# Patient Record
Sex: Female | Born: 1955 | Race: Black or African American | Hispanic: No | Marital: Single | Smoking: Never smoker
Health system: Southern US, Community
[De-identification: ages and names within clinical notes are randomized; demographics above are authoritative.]

## PROBLEM LIST (undated history)

## (undated) DIAGNOSIS — L309 Dermatitis, unspecified: Secondary | ICD-10-CM

## (undated) DIAGNOSIS — R011 Cardiac murmur, unspecified: Secondary | ICD-10-CM

## (undated) DIAGNOSIS — Z862 Personal history of diseases of the blood and blood-forming organs and certain disorders involving the immune mechanism: Secondary | ICD-10-CM

## (undated) DIAGNOSIS — Z8614 Personal history of Methicillin resistant Staphylococcus aureus infection: Secondary | ICD-10-CM

## (undated) DIAGNOSIS — Z8709 Personal history of other diseases of the respiratory system: Secondary | ICD-10-CM

## (undated) DIAGNOSIS — E559 Vitamin D deficiency, unspecified: Secondary | ICD-10-CM

## (undated) DIAGNOSIS — J309 Allergic rhinitis, unspecified: Secondary | ICD-10-CM

## (undated) DIAGNOSIS — Z872 Personal history of diseases of the skin and subcutaneous tissue: Secondary | ICD-10-CM

## (undated) DIAGNOSIS — N6019 Diffuse cystic mastopathy of unspecified breast: Secondary | ICD-10-CM

## (undated) DIAGNOSIS — H6690 Otitis media, unspecified, unspecified ear: Secondary | ICD-10-CM

## (undated) DIAGNOSIS — J029 Acute pharyngitis, unspecified: Secondary | ICD-10-CM

## (undated) HISTORY — DX: Acute pharyngitis, unspecified: J02.9

## (undated) HISTORY — DX: Dermatitis, unspecified: L30.9

## (undated) HISTORY — DX: Personal history of Methicillin resistant Staphylococcus aureus infection: Z86.14

## (undated) HISTORY — DX: Personal history of other diseases of the respiratory system: Z87.09

## (undated) HISTORY — PX: TUBAL LIGATION: SHX77

## (undated) HISTORY — DX: Vitamin D deficiency, unspecified: E55.9

## (undated) HISTORY — DX: Personal history of diseases of the blood and blood-forming organs and certain disorders involving the immune mechanism: Z86.2

## (undated) HISTORY — DX: Personal history of diseases of the skin and subcutaneous tissue: Z87.2

## (undated) HISTORY — DX: Otitis media, unspecified, unspecified ear: H66.90

## (undated) HISTORY — DX: Cardiac murmur, unspecified: R01.1

## (undated) HISTORY — DX: Allergic rhinitis, unspecified: J30.9

## (undated) HISTORY — DX: Diffuse cystic mastopathy of unspecified breast: N60.19

---

## 1997-08-09 ENCOUNTER — Ambulatory Visit (HOSPITAL_COMMUNITY): Admission: RE | Admit: 1997-08-09 | Discharge: 1997-08-09 | Payer: Self-pay | Admitting: Internal Medicine

## 1998-08-13 ENCOUNTER — Ambulatory Visit (HOSPITAL_COMMUNITY): Admission: RE | Admit: 1998-08-13 | Discharge: 1998-08-13 | Payer: Self-pay | Admitting: Internal Medicine

## 1998-08-13 ENCOUNTER — Encounter: Payer: Self-pay | Admitting: Internal Medicine

## 1999-03-06 ENCOUNTER — Encounter: Payer: Self-pay | Admitting: Emergency Medicine

## 1999-03-06 ENCOUNTER — Encounter: Admission: RE | Admit: 1999-03-06 | Discharge: 1999-03-06 | Payer: Self-pay | Admitting: Emergency Medicine

## 1999-06-22 ENCOUNTER — Other Ambulatory Visit: Admission: RE | Admit: 1999-06-22 | Discharge: 1999-06-22 | Payer: Self-pay | Admitting: Gynecology

## 1999-08-17 ENCOUNTER — Ambulatory Visit (HOSPITAL_COMMUNITY): Admission: RE | Admit: 1999-08-17 | Discharge: 1999-08-17 | Payer: Self-pay | Admitting: Internal Medicine

## 1999-08-17 ENCOUNTER — Encounter: Payer: Self-pay | Admitting: Internal Medicine

## 2000-08-02 ENCOUNTER — Other Ambulatory Visit: Admission: RE | Admit: 2000-08-02 | Discharge: 2000-08-02 | Payer: Self-pay | Admitting: Gynecology

## 2000-08-30 ENCOUNTER — Encounter: Payer: Self-pay | Admitting: Internal Medicine

## 2000-08-30 ENCOUNTER — Ambulatory Visit (HOSPITAL_COMMUNITY): Admission: RE | Admit: 2000-08-30 | Discharge: 2000-08-30 | Payer: Self-pay | Admitting: Internal Medicine

## 2000-11-30 ENCOUNTER — Other Ambulatory Visit: Admission: RE | Admit: 2000-11-30 | Discharge: 2000-11-30 | Payer: Self-pay | Admitting: Gynecology

## 2001-11-23 ENCOUNTER — Other Ambulatory Visit: Admission: RE | Admit: 2001-11-23 | Discharge: 2001-11-23 | Payer: Self-pay | Admitting: Gynecology

## 2001-11-28 ENCOUNTER — Encounter: Payer: Self-pay | Admitting: Internal Medicine

## 2001-11-28 ENCOUNTER — Ambulatory Visit (HOSPITAL_COMMUNITY): Admission: RE | Admit: 2001-11-28 | Discharge: 2001-11-28 | Payer: Self-pay | Admitting: Internal Medicine

## 2002-12-04 ENCOUNTER — Encounter: Payer: Self-pay | Admitting: Gynecology

## 2002-12-04 ENCOUNTER — Ambulatory Visit (HOSPITAL_COMMUNITY): Admission: RE | Admit: 2002-12-04 | Discharge: 2002-12-04 | Payer: Self-pay | Admitting: Gynecology

## 2002-12-11 ENCOUNTER — Encounter: Payer: Self-pay | Admitting: Gynecology

## 2002-12-11 ENCOUNTER — Encounter: Admission: RE | Admit: 2002-12-11 | Discharge: 2002-12-11 | Payer: Self-pay | Admitting: Gynecology

## 2003-01-07 ENCOUNTER — Other Ambulatory Visit: Admission: RE | Admit: 2003-01-07 | Discharge: 2003-01-07 | Payer: Self-pay | Admitting: Gynecology

## 2003-04-20 DIAGNOSIS — R011 Cardiac murmur, unspecified: Secondary | ICD-10-CM

## 2003-04-20 HISTORY — DX: Cardiac murmur, unspecified: R01.1

## 2003-05-23 ENCOUNTER — Encounter: Admission: RE | Admit: 2003-05-23 | Discharge: 2003-05-23 | Payer: Self-pay | Admitting: Internal Medicine

## 2003-12-20 ENCOUNTER — Encounter: Admission: RE | Admit: 2003-12-20 | Discharge: 2003-12-20 | Payer: Self-pay | Admitting: Gynecology

## 2004-01-08 ENCOUNTER — Other Ambulatory Visit: Admission: RE | Admit: 2004-01-08 | Discharge: 2004-01-08 | Payer: Self-pay | Admitting: Gynecology

## 2004-01-22 ENCOUNTER — Encounter (INDEPENDENT_AMBULATORY_CARE_PROVIDER_SITE_OTHER): Payer: Self-pay | Admitting: Cardiovascular Disease

## 2004-01-22 ENCOUNTER — Ambulatory Visit (HOSPITAL_COMMUNITY): Admission: RE | Admit: 2004-01-22 | Discharge: 2004-01-22 | Payer: Self-pay | Admitting: Internal Medicine

## 2004-01-30 ENCOUNTER — Encounter: Admission: RE | Admit: 2004-01-30 | Discharge: 2004-01-30 | Payer: Self-pay | Admitting: Gynecology

## 2004-07-24 ENCOUNTER — Encounter: Admission: RE | Admit: 2004-07-24 | Discharge: 2004-07-24 | Payer: Self-pay | Admitting: Gynecology

## 2004-12-01 ENCOUNTER — Encounter: Admission: RE | Admit: 2004-12-01 | Discharge: 2004-12-01 | Payer: Self-pay | Admitting: Internal Medicine

## 2005-02-11 ENCOUNTER — Other Ambulatory Visit: Admission: RE | Admit: 2005-02-11 | Discharge: 2005-02-11 | Payer: Self-pay | Admitting: Gynecology

## 2005-02-19 ENCOUNTER — Encounter: Admission: RE | Admit: 2005-02-19 | Discharge: 2005-02-19 | Payer: Self-pay | Admitting: Gynecology

## 2005-10-07 ENCOUNTER — Encounter: Admission: RE | Admit: 2005-10-07 | Discharge: 2005-10-07 | Payer: Self-pay | Admitting: Internal Medicine

## 2006-01-27 ENCOUNTER — Encounter: Admission: RE | Admit: 2006-01-27 | Discharge: 2006-01-27 | Payer: Self-pay | Admitting: Occupational Medicine

## 2006-08-23 ENCOUNTER — Other Ambulatory Visit: Admission: RE | Admit: 2006-08-23 | Discharge: 2006-08-23 | Payer: Self-pay | Admitting: Gynecology

## 2007-06-16 IMAGING — CR DG WRIST 2V*R*
1 series · 1 of 1 positions shown · non-contrast
Comparison: none

CLINICAL DATA: Radial right wrist pain for the past 2 to 3 months. No known
injury.

RIGHT WRIST - 2 VIEW

[view not recorded]
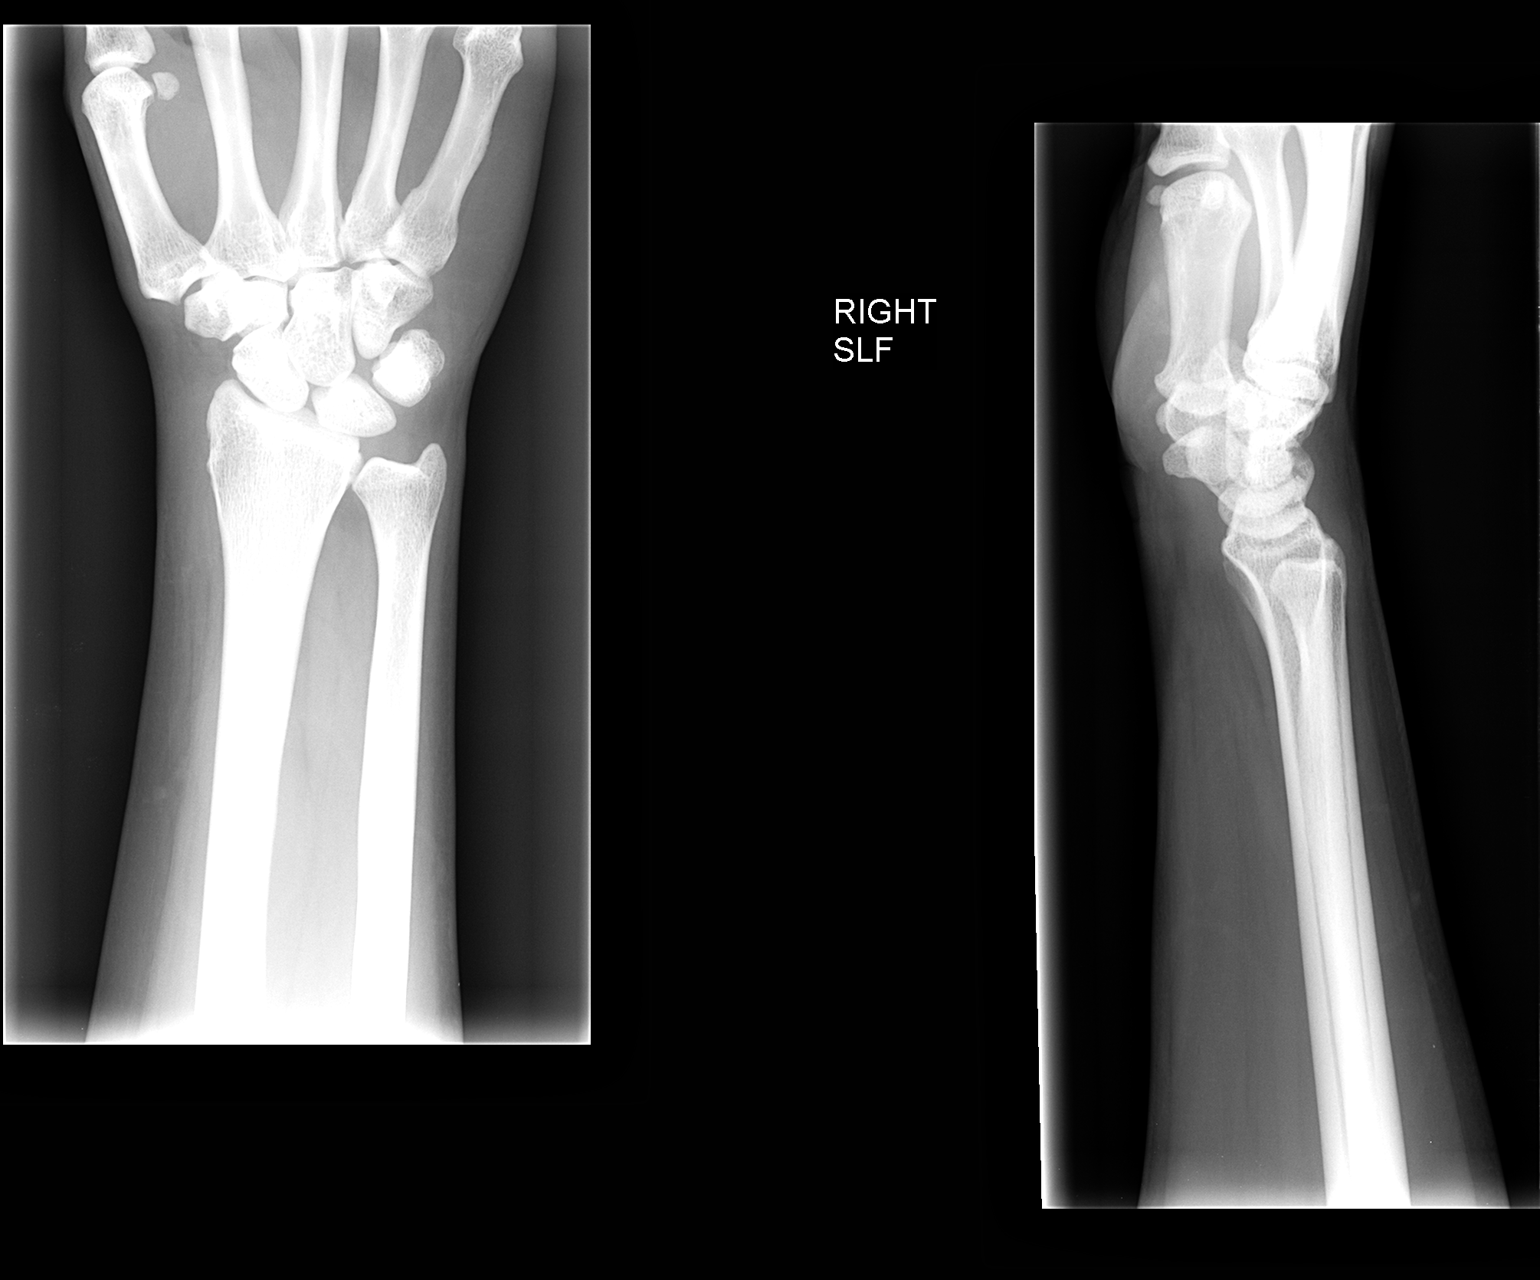

[1 of 1 positions shown; findings below may reference images not displayed]

FINDINGS: Normal appearing bones and soft tissues.

IMPRESSION

Normal examination.

## 2007-09-08 ENCOUNTER — Other Ambulatory Visit: Admission: RE | Admit: 2007-09-08 | Discharge: 2007-09-08 | Payer: Self-pay | Admitting: Gynecology

## 2008-04-29 ENCOUNTER — Encounter: Admission: RE | Admit: 2008-04-29 | Discharge: 2008-04-29 | Payer: Self-pay | Admitting: Internal Medicine

## 2011-05-10 ENCOUNTER — Other Ambulatory Visit: Payer: Self-pay | Admitting: Internal Medicine

## 2011-05-10 ENCOUNTER — Ambulatory Visit
Admission: RE | Admit: 2011-05-10 | Discharge: 2011-05-10 | Disposition: A | Discharge status: Home / Self Care | Payer: BC Managed Care – PPO | Source: Ambulatory Visit | Attending: Internal Medicine | Admitting: Internal Medicine

## 2011-05-10 DIAGNOSIS — R05 Cough: Secondary | ICD-10-CM

## 2011-09-03 ENCOUNTER — Emergency Department (HOSPITAL_COMMUNITY)
Admission: EM | Admit: 2011-09-03 | Discharge: 2011-09-03 | Disposition: A | Discharge status: Home / Self Care | Payer: No Typology Code available for payment source | Attending: Emergency Medicine | Admitting: Emergency Medicine

## 2011-09-03 ENCOUNTER — Encounter (HOSPITAL_COMMUNITY): Payer: Self-pay | Admitting: *Deleted

## 2011-09-03 DIAGNOSIS — Y92410 Unspecified street and highway as the place of occurrence of the external cause: Secondary | ICD-10-CM | POA: Insufficient documentation

## 2011-09-03 DIAGNOSIS — S335XXA Sprain of ligaments of lumbar spine, initial encounter: Secondary | ICD-10-CM | POA: Insufficient documentation

## 2011-09-03 DIAGNOSIS — S39012A Strain of muscle, fascia and tendon of lower back, initial encounter: Secondary | ICD-10-CM

## 2011-09-03 DIAGNOSIS — Y998 Other external cause status: Secondary | ICD-10-CM | POA: Insufficient documentation

## 2011-09-03 MED ORDER — PREDNISONE 20 MG PO TABS
60.0000 mg | ORAL_TABLET | Freq: Once | ORAL | Status: AC
  Administered 2011-09-03: 60 mg via ORAL
  Filled 2011-09-03: qty 3

## 2011-09-03 MED ORDER — CYCLOBENZAPRINE HCL 5 MG PO TABS: 5.0000 mg | ORAL_TABLET | Freq: Two times a day (BID) | ORAL | Status: AC | PRN

## 2011-09-03 MED ORDER — PREDNISONE 20 MG PO TABS: 40.0000 mg | ORAL_TABLET | Freq: Every day | ORAL | Status: AC

## 2011-09-03 MED ORDER — HYDROCODONE-ACETAMINOPHEN 5-325 MG PO TABS: 2.0000 | ORAL_TABLET | ORAL | Status: AC | PRN

## 2011-09-03 MED ORDER — CYCLOBENZAPRINE HCL 10 MG PO TABS
5.0000 mg | ORAL_TABLET | Freq: Once | ORAL | Status: AC
  Administered 2011-09-03: 5 mg via ORAL
  Filled 2011-09-03: qty 2

## 2011-09-03 NOTE — ED Notes (Signed)
NP at bedside.
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
## 2011-09-03 NOTE — Discharge Instructions (Signed)

## 2011-09-04 NOTE — ED Provider Notes (Signed)
Medical screening examination/treatment/procedure(s) were performed by non-physician practitioner and as supervising physician I was immediately available for consultation/collaboration.   Loren Racer, MD 09/04/11 [phone]

## 2011-10-29 ENCOUNTER — Ambulatory Visit (HOSPITAL_COMMUNITY)
Admission: RE | Admit: 2011-10-29 | Discharge: 2011-10-29 | Disposition: A | Discharge status: Home / Self Care | Payer: BC Managed Care – PPO | Source: Ambulatory Visit | Attending: Internal Medicine | Admitting: Internal Medicine

## 2011-10-29 ENCOUNTER — Other Ambulatory Visit: Payer: Self-pay | Admitting: Internal Medicine

## 2011-10-29 DIAGNOSIS — E039 Hypothyroidism, unspecified: Secondary | ICD-10-CM

## 2011-10-29 DIAGNOSIS — E049 Nontoxic goiter, unspecified: Secondary | ICD-10-CM | POA: Insufficient documentation

## 2012-05-04 ENCOUNTER — Encounter: Payer: Self-pay | Admitting: *Deleted

## 2012-05-04 DIAGNOSIS — E559 Vitamin D deficiency, unspecified: Secondary | ICD-10-CM | POA: Insufficient documentation

## 2012-05-04 DIAGNOSIS — H6690 Otitis media, unspecified, unspecified ear: Secondary | ICD-10-CM | POA: Insufficient documentation

## 2019-11-20 ENCOUNTER — Ambulatory Visit: Payer: Self-pay | Attending: Internal Medicine

## 2019-11-20 DIAGNOSIS — Z23 Encounter for immunization: Secondary | ICD-10-CM

## 2019-11-20 NOTE — Progress Notes (Signed)
   Covid-19 Vaccination Clinic  Name:  Shawna Stephens    MRN: [id number] DOB: 1956/01/05  11/20/2019  [name] was observed post Covid-19 immunization for 15 minutes without incident. She was provided with Vaccine Information Sheet and instruction to access the V-Safe system.   [name] was instructed to call 911 with any severe reactions post vaccine: Marland Kitchen Difficulty breathing  . Swelling of face and throat  . A fast heartbeat  . A bad rash all over body  . Dizziness and weakness   Immunizations Administered    Name Date Dose VIS Date Route   Moderna COVID-19 Vaccine 11/20/2019  3:18 PM 0.5 mL 03/2019 Intramuscular   Manufacturer: Moderna   Lot: [id number]   NDC: 15830-940-76

## 2019-12-17 ENCOUNTER — Other Ambulatory Visit: Payer: Self-pay | Admitting: Occupational Medicine

## 2019-12-17 ENCOUNTER — Ambulatory Visit: Payer: Self-pay

## 2019-12-17 ENCOUNTER — Other Ambulatory Visit: Payer: Self-pay

## 2019-12-17 DIAGNOSIS — M79671 Pain in right foot: Secondary | ICD-10-CM

## 2019-12-18 ENCOUNTER — Ambulatory Visit: Payer: Self-pay | Attending: Internal Medicine

## 2019-12-18 DIAGNOSIS — Z23 Encounter for immunization: Secondary | ICD-10-CM

## 2019-12-18 NOTE — Progress Notes (Signed)
   Covid-19 Vaccination Clinic  Name:  Shawna Stephens    MRN: [id number] DOB: [birth year]  12/18/2019  Ms. Shawna Stephens was observed post Covid-19 immunization for 15 minutes without incident. She was provided with Vaccine Information Sheet and instruction to access the V-Safe system.   [name] was instructed to call 911 with any severe reactions post vaccine: Marland Kitchen Difficulty breathing  . Swelling of face and throat  . A fast heartbeat  . A bad rash all over body  . Dizziness and weakness   Immunizations Administered    Name Date Dose VIS Date Route   Moderna COVID-19 Vaccine 12/18/2019  3:17 PM 0.5 mL 03/2019 Intramuscular   Manufacturer: Moderna   Lot: [id number]   NDC: 67341-937-90

## 2021-03-17 ENCOUNTER — Other Ambulatory Visit: Payer: Self-pay | Admitting: Internal Medicine

## 2021-03-17 ENCOUNTER — Ambulatory Visit
Admission: RE | Admit: 2021-03-17 | Discharge: 2021-03-17 | Disposition: A | Discharge status: Home / Self Care | Payer: BC Managed Care – PPO | Source: Ambulatory Visit | Attending: Internal Medicine | Admitting: Internal Medicine

## 2021-03-17 DIAGNOSIS — R053 Chronic cough: Secondary | ICD-10-CM

## 2021-04-07 ENCOUNTER — Other Ambulatory Visit (HOSPITAL_COMMUNITY): Payer: Self-pay

## 2021-04-07 DIAGNOSIS — R1310 Dysphagia, unspecified: Secondary | ICD-10-CM

## 2021-05-04 ENCOUNTER — Ambulatory Visit (HOSPITAL_COMMUNITY): Payer: BC Managed Care – PPO

## 2021-05-04 ENCOUNTER — Other Ambulatory Visit: Payer: Self-pay

## 2021-05-04 ENCOUNTER — Encounter (HOSPITAL_COMMUNITY): Payer: Self-pay

## 2021-05-04 ENCOUNTER — Ambulatory Visit (HOSPITAL_COMMUNITY): Admission: RE | Admit: 2021-05-04 | Payer: BC Managed Care – PPO | Source: Ambulatory Visit

## 2021-05-12 ENCOUNTER — Other Ambulatory Visit: Payer: Self-pay

## 2021-05-12 ENCOUNTER — Ambulatory Visit (HOSPITAL_COMMUNITY)
Admission: RE | Admit: 2021-05-12 | Discharge: 2021-05-12 | Disposition: A | Discharge status: Home / Self Care | Payer: BC Managed Care – PPO | Source: Ambulatory Visit | Attending: Internal Medicine | Admitting: Internal Medicine

## 2021-05-12 DIAGNOSIS — R1310 Dysphagia, unspecified: Secondary | ICD-10-CM | POA: Insufficient documentation

## 2022-08-04 IMAGING — CR DG CHEST 2V
2 series · 2 of 2 positions shown · non-contrast
Comparison: May 10, 2011.

CLINICAL DATA: Productive cough.

EXAM:
CHEST - 2 VIEW

[w chest pa]
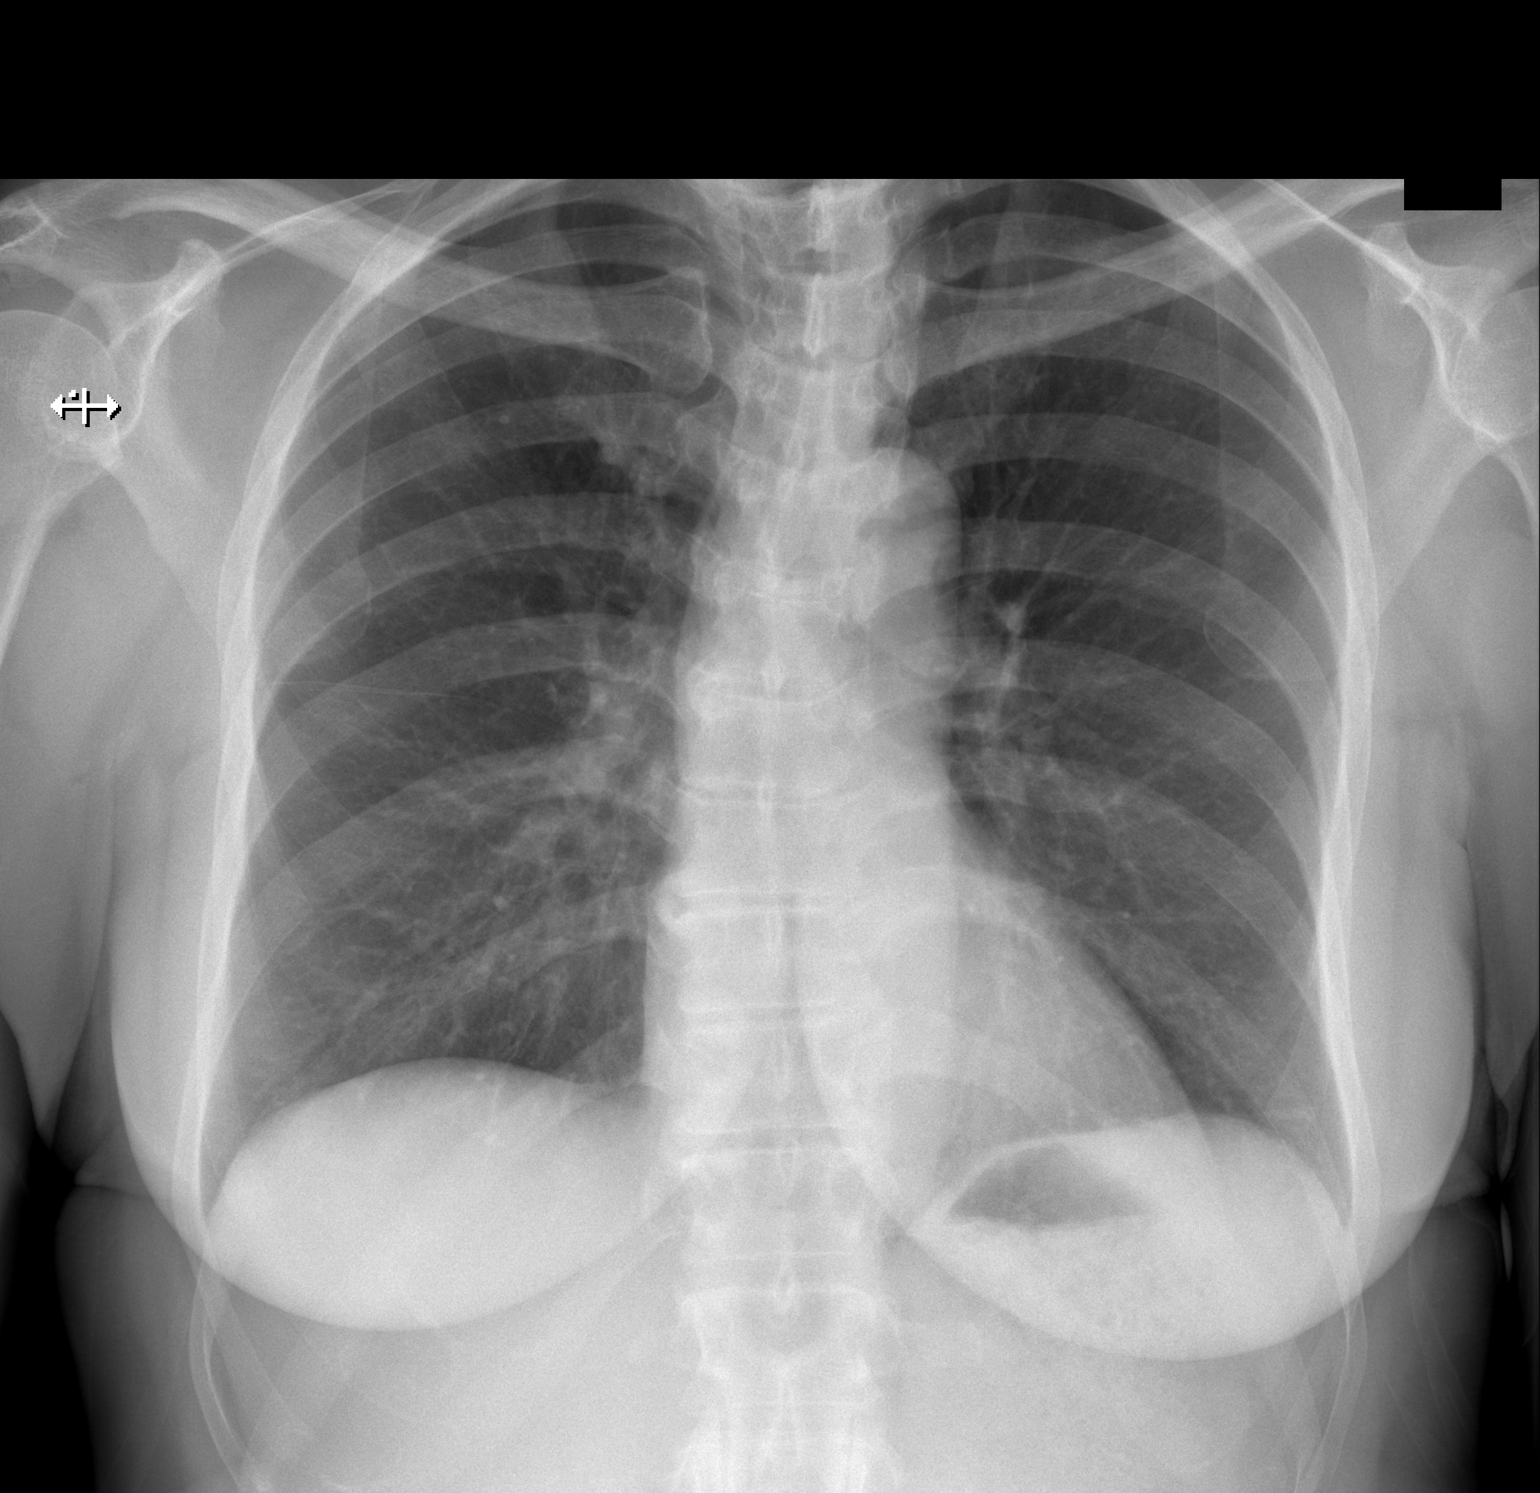

[w chest lat]
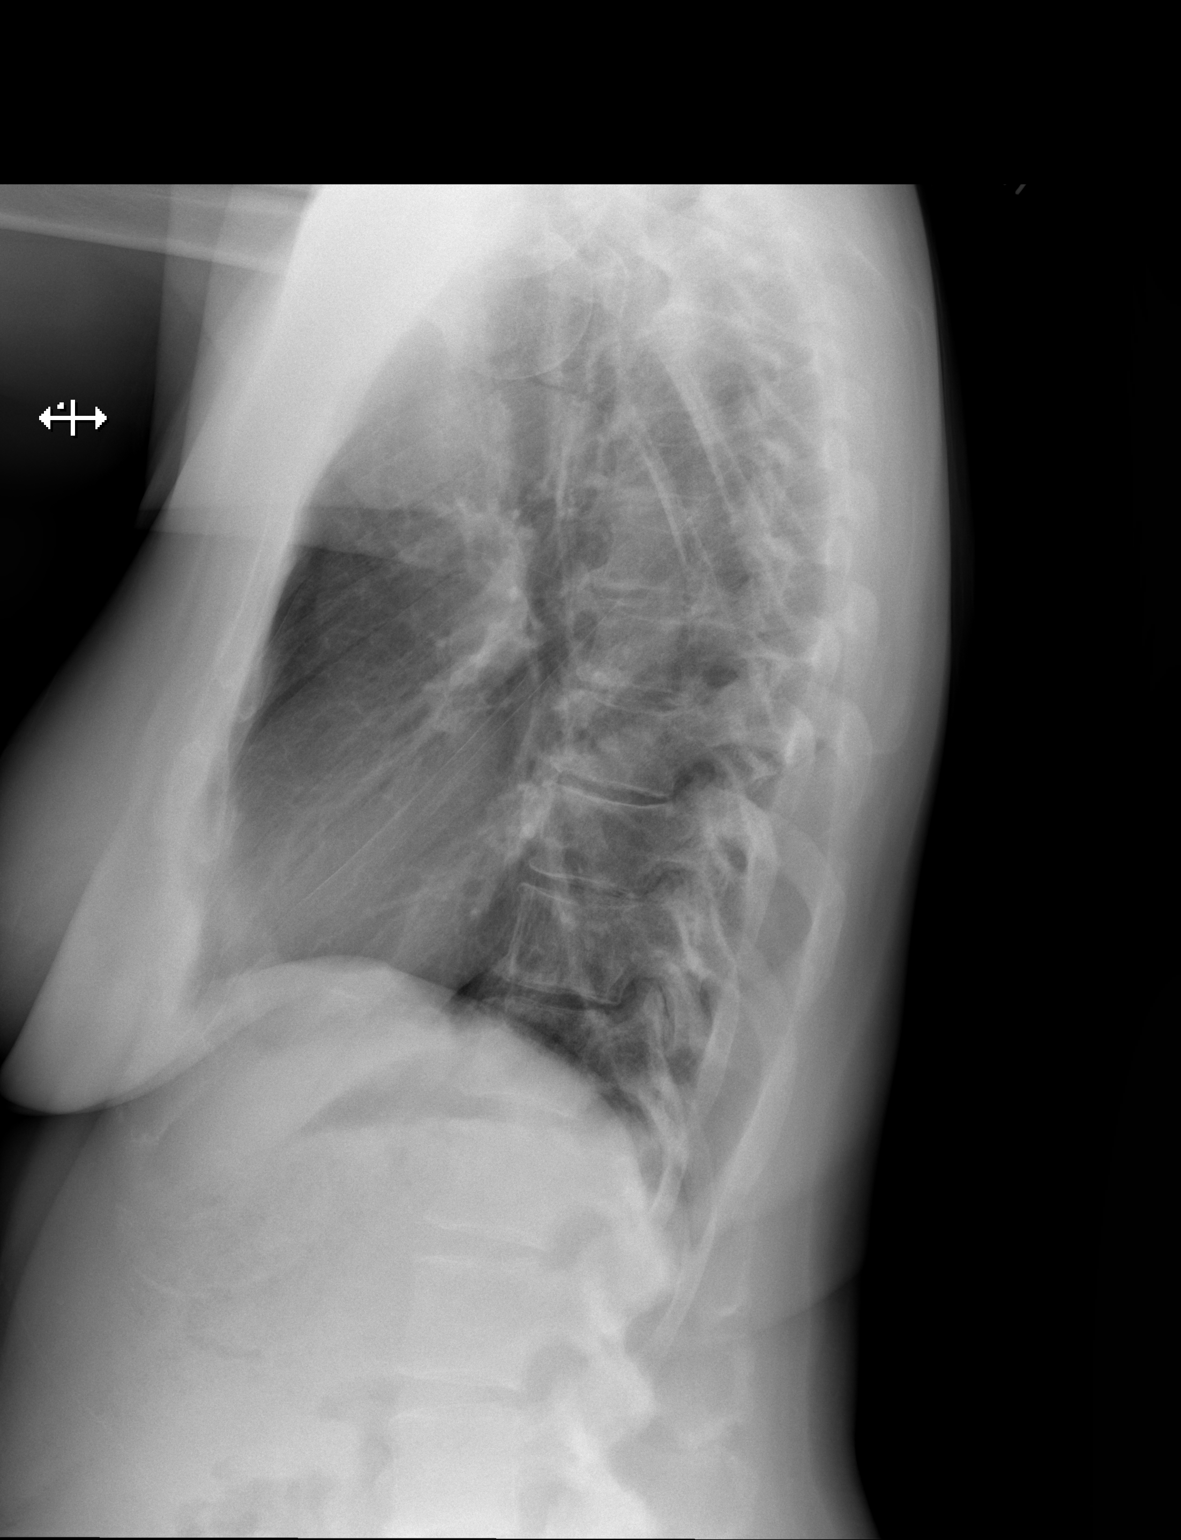

[2 of 2 positions shown; findings below may reference images not displayed]

FINDINGS: The heart size and mediastinal contours are within normal limits.
Both lungs are clear. The visualized skeletal structures are
unremarkable.
IMPRESSION: No active cardiopulmonary disease.

## 2023-03-08 ENCOUNTER — Other Ambulatory Visit: Payer: Self-pay | Admitting: Internal Medicine

## 2023-03-08 ENCOUNTER — Ambulatory Visit
Admission: RE | Admit: 2023-03-08 | Discharge: 2023-03-08 | Disposition: A | Discharge status: Home / Self Care | Payer: BC Managed Care – PPO | Source: Ambulatory Visit | Attending: Internal Medicine | Admitting: Internal Medicine

## 2023-03-08 DIAGNOSIS — M25551 Pain in right hip: Secondary | ICD-10-CM

## 2023-08-26 ENCOUNTER — Ambulatory Visit: Admitting: Podiatry

## 2023-09-02 ENCOUNTER — Ambulatory Visit (INDEPENDENT_AMBULATORY_CARE_PROVIDER_SITE_OTHER): Admitting: Podiatry

## 2023-09-02 ENCOUNTER — Encounter: Payer: Self-pay | Admitting: Podiatry

## 2023-09-02 VITALS — Ht 68.0 in | Wt 168.0 lb

## 2023-09-02 DIAGNOSIS — M722 Plantar fascial fibromatosis: Secondary | ICD-10-CM | POA: Diagnosis not present

## 2023-09-02 DIAGNOSIS — Q666 Other congenital valgus deformities of feet: Secondary | ICD-10-CM

## 2023-09-02 NOTE — Progress Notes (Signed)
  Subjective:  Patient ID: Shawna Stephens, female    DOB: 03-20-1956,  MRN: [id number]  Chief Complaint  Patient presents with   Foot Pain    Patient is here for left heel pain that started 2 months ago.    68 y.o. female presents with the above complaint.  Patient presents with left heel pain that started about 2 months ago has progressed gotten worse worse with ambulation is with pressure has not seen MRIs prior to seeing me denies any other acute complaints would like to discuss treatment option she does not want steroid injection as she has a high risk of developing keloid infection.  Pain scale 7 out of 10 dull aching nature   Review of Systems: Negative except as noted in the HPI. Denies N/V/F/Ch.  Past Medical History:  Diagnosis Date   Allergic rhinitis    Dermatitis    nonspecific   Fibrocystic breast disease    H/O keloid of skin    Heart murmur 2005   benign   History of anemia    mild   Hx MRSA infection    Hx of restrictive lung disease    mild   Otitis media    asymptomatic left   Pharyngitis    nonspecific   Vitamin D deficiency    No current outpatient medications on file.  Social History   Tobacco Use  Smoking Status Never  Smokeless Tobacco Not on file    Allergies  Allergen Reactions   Diclofenac Itching   Objective:  There were no vitals filed for this visit. Body mass index is 25.54 kg/m. Constitutional Well developed. Well nourished.  Vascular Dorsalis pedis pulses palpable bilaterally. Posterior tibial pulses palpable bilaterally. Capillary refill normal to all digits.  No cyanosis or clubbing noted. Pedal hair growth normal.  Neurologic Normal speech. Oriented to person, place, and time. Epicritic sensation to light touch grossly present bilaterally.  Dermatologic Nails well groomed and normal in appearance. No open wounds. No skin lesions.  Orthopedic: Normal joint ROM without pain or crepitus bilaterally. No visible  deformities. Tender to palpation at the calcaneal tuber left. No pain with calcaneal squeeze left. Ankle ROM diminished range of motion left. Silfverskiold Test: negative bilaterally.   Radiographs: None  Assessment:  No diagnosis found. Plan:  Patient was evaluated and treated and all questions answered.  Plantar Fasciitis, left - XR reviewed as above.  - Educated on icing and stretching. Instructions given.  - Injection delivered to the plantar fascia as below. - DME: Plantar fascial brace dispensed to support the medial longitudinal arch of the foot and offload pressure from the heel and prevent arch collapse during weightbearing - Pharmacologic management: None  Pes planovalgus -I explained to patient the etiology of pes planovalgus and relationship with Planter fasciitis and various treatment options were discussed.  Given patient foot structure in the setting of Planter fasciitis I believe patient will benefit from custom-made orthotics to help control the hindfoot motion support the arch of the foot and take the stress away from plantar fascial.  Patient agrees with the plan like to proceed with orthotics -Patient was casted for orthotics   Procedure: Injection Tendon/Ligament Location: Left plantar fascia at the glabrous junction; medial approach. Skin Prep: alcohol Injectate: 0.5 cc 0.5% marcaine plain, 0.5 cc of 1% Lidocaine, 0.5 cc kenalog 10. Disposition: Patient tolerated procedure well. Injection site dressed with a band-aid.  No follow-ups on file.

## 2023-10-05 ENCOUNTER — Telehealth: Payer: Self-pay | Admitting: Podiatry

## 2023-10-05 ENCOUNTER — Ambulatory Visit (INDEPENDENT_AMBULATORY_CARE_PROVIDER_SITE_OTHER): Admitting: Podiatry

## 2023-10-05 DIAGNOSIS — M722 Plantar fascial fibromatosis: Secondary | ICD-10-CM | POA: Diagnosis not present

## 2023-10-05 DIAGNOSIS — Q666 Other congenital valgus deformities of feet: Secondary | ICD-10-CM

## 2023-10-05 MED ORDER — MELOXICAM 15 MG PO TABS: 15.0000 mg | ORAL_TABLET | Freq: Every day | ORAL | 0 refills | Status: AC

## 2023-10-05 MED ORDER — METHYLPREDNISOLONE 4 MG PO TBPK: ORAL_TABLET | ORAL | 0 refills | Status: AC

## 2023-10-05 NOTE — Progress Notes (Signed)
  Subjective:  Patient ID: Shawna Stephens, female    DOB: 04/15/1956,  MRN: [id number]  Chief Complaint  Patient presents with   Foot Pain    Pt stated that the pain is about the same     68 y.o. female presents with the above complaint.  Patient presents with follow-up of left heel pain.  She states that the pain is doing a little bit better the bracing helps.  She is also here to pick up her orthotics denies any other acute complaints   Review of Systems: Negative except as noted in the HPI. Denies N/V/F/Ch.  Past Medical History:  Diagnosis Date   Allergic rhinitis    Dermatitis    nonspecific   Fibrocystic breast disease    H/O keloid of skin    Heart murmur 2005   benign   History of anemia    mild   Hx MRSA infection    Hx of restrictive lung disease    mild   Otitis media    asymptomatic left   Pharyngitis    nonspecific   Vitamin D deficiency     Current Outpatient Medications:    meloxicam (MOBIC) 15 MG tablet, Take 1 tablet (15 mg total) by mouth daily., Disp: 30 tablet, Rfl: 0   methylPREDNISolone (MEDROL DOSEPAK) 4 MG TBPK tablet, Take as directed, Disp: 21 each, Rfl: 0  Social History   Tobacco Use  Smoking Status Never  Smokeless Tobacco Not on file    Allergies  Allergen Reactions   Diclofenac Itching   Objective:  There were no vitals filed for this visit. There is no height or weight on file to calculate BMI. Constitutional Well developed. Well nourished.  Vascular Dorsalis pedis pulses palpable bilaterally. Posterior tibial pulses palpable bilaterally. Capillary refill normal to all digits.  No cyanosis or clubbing noted. Pedal hair growth normal.  Neurologic Normal speech. Oriented to person, place, and time. Epicritic sensation to light touch grossly present bilaterally.  Dermatologic Nails well groomed and normal in appearance. No open wounds. No skin lesions.  Orthopedic: Normal joint ROM without pain or crepitus  bilaterally. No visible deformities. Tender to palpation at the calcaneal tuber left. No pain with calcaneal squeeze left. Ankle ROM diminished range of motion left. Silfverskiold Test: negative bilaterally.   Radiographs: None  Assessment:   1. Pes planovalgus [Q66.6]   2. Plantar fasciitis of left foot [M72.2]    Plan:  Patient was evaluated and treated and all questions answered.  Plantar Fasciitis, left - XR reviewed as above.  - Educated on icing and stretching. Instructions given.  - Patient has what injection would like to focus on oral medication and bracing - DME: Plantar fascial brace dispensed to support the medial longitudinal arch of the foot and offload pressure from the heel and prevent arch collapse during weightbearing - Pharmacologic management: Medrol Dosepak and meloxicam  Pes planovalgus -I explained to patient the etiology of pes planovalgus and relationship with Planter fasciitis and various treatment options were discussed.  Given patient foot structure in the setting of Planter fasciitis I believe patient will benefit from custom-made orthotics to help control the hindfoot motion support the arch of the foot and take the stress away from plantar fascial.  Patient agrees with the plan like to proceed with orthotics - Orthotics were dispensed they are functioning well   No follow-ups on file.

## 2023-10-05 NOTE — Telephone Encounter (Signed)
 lft mess on vmail for pt to call me back. She advised front desk rep she had question about FMLA

## 2023-10-10 ENCOUNTER — Telehealth: Payer: Self-pay | Admitting: Podiatry

## 2023-10-10 NOTE — Telephone Encounter (Signed)
 cld pt back-had lft mess 10/05/23. She is going to have her job send me FMLA-intermittent forms for her and will need accommodations no more than 10 hours and 2-3 per week(10 hrs) from 10/05/23-04/18/24. She said will not need all the time,  I gave the pt my fax#

## 2023-10-12 ENCOUNTER — Telehealth: Payer: Self-pay | Admitting: Podiatry

## 2023-10-12 DIAGNOSIS — Z0271 Encounter for disability determination: Secondary | ICD-10-CM

## 2023-10-12 NOTE — Telephone Encounter (Signed)
 Recd [employer] form via fax. Faxed form/notes to [phone] and mailed a copy to the patient at the address on file.

## 2023-11-01 ENCOUNTER — Other Ambulatory Visit: Payer: Self-pay | Admitting: Podiatry

## 2023-11-01 ENCOUNTER — Encounter: Payer: Self-pay | Admitting: Dermatology

## 2023-11-01 ENCOUNTER — Ambulatory Visit (INDEPENDENT_AMBULATORY_CARE_PROVIDER_SITE_OTHER): Payer: BC Managed Care – PPO | Admitting: Dermatology

## 2023-11-01 VITALS — BP 117/81 | HR 90

## 2023-11-01 DIAGNOSIS — L910 Hypertrophic scar: Secondary | ICD-10-CM

## 2023-11-01 DIAGNOSIS — L300 Nummular dermatitis: Secondary | ICD-10-CM

## 2023-11-01 NOTE — Progress Notes (Signed)
   New Patient Visit   Subjective  Shawna Stephens is a 68 y.o. female who presents for the following: Keloid  Patient states she has keloids located at the scattered that she would like to have examined. Patient reports the areas have been there for several years. She reports the areas are bothersome. She reports the area can be very itchy. Patient rates irritation 5 out of 10. Patient reports she has previously been treated for these areas (Dr. Debarah). She has had Keloids removed from her ears and she has had some injected with Kenalog.  The following portions of the chart were reviewed this encounter and updated as appropriate: medications, allergies, medical history  Review of Systems:  No other skin or systemic complaints except as noted in HPI or Assessment and Plan.  Objective  Well appearing patient in no apparent distress; mood and affect are within normal limits.  A full examination was performed including scalp, head, eyes, ears, nose, lips, neck, chest, axillae, abdomen, back, buttocks, bilateral upper extremities, bilateral lower extremities, hands, feet, fingers, toes, fingernails, and toenails. All findings within normal limits unless otherwise noted below.   Relevant exam findings are noted in the Assessment and Plan.         Assessment & Plan   1. Keloids - Assessment: Patient has a history of keloid formation, with multiple keloids present on various body parts including ears, chest, and arm. These keloids resulted from various injuries, including a vaccination, pimple, and surgery. Previous treatment by Dr. Milissa approximately 25 years ago included intralesional injections, which provided some flattening effect, particularly on the ears. The patient also underwent surgery for one of the keloids. The most recent keloid formed from a pimple. The keloids are described as varying in size and thickness, with some appearing partially treated (flat in the middle).  - Plan:     Recommend intralesional corticosteroid injections for flattening and reducing itching of keloids    Suggest use of silicone patches (Scar-Away) as a secondary treatment option:     - Apply patch daily, leaving on for 24 hours     - Remove patch at night, massage area for 10 minutes, then reapply new patch     - Continue treatment for approximately 6 months    Advise patient to apply patches to both new and old keloid areas for comparison    Include Scar-Away recommendation in after-visit summary  2. Nummular Eczema - Assessment: Patient reports itching on legs during winter months for the past couple of years, with visible discoloration. 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 The condition is distinct from childhood eczema and often begins with itching before visible skin changes occur.  - Plan:    Prescribe triamcinolone  0.1% topical cream:     - Apply twice daily for 2 weeks when flared     - If symptoms persist, advise 2 weeks on, 2 weeks off treatment cycle     - Discontinue use if symptoms resolve before 2 weeks    Educate patient on nummular eczema characteristics and management   No follow-ups on file.  I, Jetta Ager, am acting as Neurosurgeon for [employer], DO.  Documentation: I have reviewed the above documentation for accuracy and completeness, and I agree with the above.  Delon Lenis, DO

## 2023-11-01 NOTE — Patient Instructions (Addendum)
 Date: Tue Nov 01 2023  Hello Shawna Stephens,  Thank you for visiting today. Here is a summary of the key instructions:  - Scar-Away Silicone Patches:   - Apply patch at night before bed   - Leave on for 24 hours   - Remove patch the next night   - Massage area for 10 minutes   - Apply new patch   - Repeat daily    - Treatment Areas:   - Apply patches to one new keloid area   - Apply patches to one old keloid area to compare results   - Continue treatment for 6 months  - Triamcinolone  0.1% Cream:   - Apply twice daily for up to 2 weeks when flared   - Stop for 2 weeks after each 2-week treatment period   - Can stop earlier if symptoms improve  - Skin Care Instructions:   - Apply Scar-Away patches to clean, dry skin before using any lotions   - Massage keloid areas for 10 minutes when changing patches  - Follow-up: Return for appointment in 6 months  Please reach out if you have any questions or concerns.  Warm regards,  Dr. Delon Lenis Dermat[facility]      Important Information   Due to recent changes in healthcare laws, you may see results of your pathology and/or laboratory studies on MyChart before the doctors have had a chance to review them. We understand that in some cases there may be results that are confusing or concerning to you. Please understand that not all results are received at the same time and often the doctors may need to interpret multiple results in order to provide you with the best plan of care or course of treatment. Therefore, we ask that you please give us  2 business days to thoroughly review all your results before contacting the office for clarification. Should we see a critical lab result, you will be contacted sooner.     If You Need Anything After Your Visit   If you have any questions or concerns for your doctor, please call our main line at [phone]. If no one answers, please leave a voicemail as directed and we will return your  call as soon as possible. Messages left after 4 pm will be answered the following business day.    You may also send us  a message via MyChart. We typically respond to MyChart messages within 1-2 business days.  For prescription refills, please ask your pharmacy to contact our office. Our fax number is [phone].  If you have an urgent issue when the clinic is closed that cannot wait until the next business day, you can page your doctor at the number below.     Please note that while we do our best to be available for urgent issues outside of office hours, we are not available 24/7.    If you have an urgent issue and are unable to reach us , you may choose to seek medical care at your doctor's office, retail clinic, urgent care center, or emergency room.   If you have a medical emergency, please immediately call 911 or go to the emergency department. In the event of inclement weather, please call our main line at [phone] for an update on the status of any delays or closures.  Dermatology Medication Tips: Please keep the boxes that topical medications come in in order to help keep track of the instructions about where and how to use these. Pharmacies typically print  the medication instructions only on the boxes and not directly on the medication tubes.   If your medication is too expensive, please contact our office at [phone] or send us  a message through MyChart.    We are unable to tell what your co-pay for medications will be in advance as this is different depending on your insurance coverage. However, we may be able to find a substitute medication at lower cost or fill out paperwork to get insurance to cover a needed medication.    If a prior authorization is required to get your medication covered by your insurance company, please allow us  1-2 business days to complete this process.   Drug prices often vary depending on where the prescription is filled and some pharmacies may  offer cheaper prices.   The website www.goodrx.com contains coupons for medications through different pharmacies. The prices here do not account for what the cost may be with help from insurance (it may be cheaper with your insurance), but the website can give you the price if you did not use any insurance.  - You can print the associated coupon and take it with your prescription to the pharmacy.  - You may also stop by our office during regular business hours and pick up a GoodRx coupon card.  - If you need your prescription sent electronically to a different pharmacy, notify our office through [facility] or by phone at [phone]

## 2023-11-01 NOTE — Telephone Encounter (Signed)
 cld pt and she adv recd forms and thinks they need more info. I adv her to call them and she can inq what is needed and if more is needed from us  to let me know. she thought they let us  know if she is approved. I adv they contact her not us 

## 2023-11-02 ENCOUNTER — Ambulatory Visit: Admitting: Podiatry

## 2023-11-18 ENCOUNTER — Ambulatory Visit (INDEPENDENT_AMBULATORY_CARE_PROVIDER_SITE_OTHER): Admitting: Podiatry

## 2023-11-18 DIAGNOSIS — M722 Plantar fascial fibromatosis: Secondary | ICD-10-CM

## 2023-11-18 DIAGNOSIS — Q666 Other congenital valgus deformities of feet: Secondary | ICD-10-CM

## 2023-11-18 NOTE — Progress Notes (Signed)
  Subjective:  Patient ID: Shawna Stephens, female    DOB: 08/23/1955,  MRN: [id number]  Chief Complaint  Patient presents with   Plantar Fasciitis    Pt stated that she is doing much better     68 y.o. female presents with the above complaint.  Patient presents with bilateral plantar fasciitis follow-up.  She states she is doing better.  It is manageable she still has some residual pain denies any other acute complaints meloxicam  helped   Review of Systems: Negative except as noted in the HPI. Denies N/V/F/Ch.  Past Medical History:  Diagnosis Date   Allergic rhinitis    Dermatitis    nonspecific   Fibrocystic breast disease    H/O keloid of skin    Heart murmur 2005   benign   History of anemia    mild   Hx MRSA infection    Hx of restrictive lung disease    mild   Otitis media    asymptomatic left   Pharyngitis    nonspecific   Vitamin D deficiency     Current Outpatient Medications:    meloxicam  (MOBIC ) 15 MG tablet, Take 1 tablet (15 mg total) by mouth daily., Disp: 30 tablet, Rfl: 0   methylPREDNISolone  (MEDROL  DOSEPAK) 4 MG TBPK tablet, Take as directed (Patient not taking: Reported on 11/01/2023), Disp: 21 each, Rfl: 0  Social History   Tobacco Use  Smoking Status Never  Smokeless Tobacco Not on file    Allergies  Allergen Reactions   Diclofenac Itching   Objective:  There were no vitals filed for this visit. There is no height or weight on file to calculate BMI. Constitutional Well developed. Well nourished.  Vascular Dorsalis pedis pulses palpable bilaterally. Posterior tibial pulses palpable bilaterally. Capillary refill normal to all digits.  No cyanosis or clubbing noted. Pedal hair growth normal.  Neurologic Normal speech. Oriented to person, place, and time. Epicritic sensation to light touch grossly present bilaterally.  Dermatologic Nails well groomed and normal in appearance. No open wounds. No skin lesions.  Orthopedic: Normal joint  ROM without pain or crepitus bilaterally. No visible deformities. Tender to palpation at the calcaneal tuber left. No pain with calcaneal squeeze left. Ankle ROM diminished range of motion left. Silfverskiold Test: negative bilaterally.   Radiographs: None  Assessment:   1. Plantar fasciitis of left foot    Plan:  Patient was evaluated and treated and all questions answered.  Plantar Fasciitis, left - XR reviewed as above.  - Educated on icing and stretching. Instructions given.  - Patient has what injection would like to focus on oral medication and bracing - DME: Plantar fascial brace dispensed to support the medial longitudinal arch of the foot and offload pressure from the heel and prevent arch collapse during weightbearing - Pharmacologic management: Continue meloxicam  as needed - Physical therapy prescription was given for the patient she will start immediately  Pes planovalgus -I explained to patient the etiology of pes planovalgus and relationship with Planter fasciitis and various treatment options were discussed.  Given patient foot structure in the setting of Planter fasciitis I believe patient will benefit from custom-made orthotics to help control the hindfoot motion support the arch of the foot and take the stress away from plantar fascial.  Patient agrees with the plan like to proceed with orthotics - Orthotics were dispensed they are functioning well   No follow-ups on file.

## 2023-12-23 ENCOUNTER — Ambulatory Visit: Admitting: Podiatry

## 2023-12-23 DIAGNOSIS — M722 Plantar fascial fibromatosis: Secondary | ICD-10-CM

## 2023-12-23 NOTE — Progress Notes (Signed)
  Subjective:  Patient ID: Shawna Stephens, female    DOB: 10-12-1955,  MRN: [id number]  Chief Complaint  Patient presents with   Plantar Fasciitis    Pt stated that she is doing okay she is still having some discomfort     68 y.o. female presents with the above complaint.  Patient presents with bilateral plantar fasciitis follow-up.  She states she is doing better.  It is manageable she still has some residual pain denies any other acute complaints meloxicam  helped   Review of Systems: Negative except as noted in the HPI. Denies N/V/F/Ch.  Past Medical History:  Diagnosis Date   Allergic rhinitis    Dermatitis    nonspecific   Fibrocystic breast disease    H/O keloid of skin    Heart murmur 2005   benign   History of anemia    mild   Hx MRSA infection    Hx of restrictive lung disease    mild   Otitis media    asymptomatic left   Pharyngitis    nonspecific   Vitamin D deficiency     Current Outpatient Medications:    meloxicam  (MOBIC ) 15 MG tablet, Take 1 tablet (15 mg total) by mouth daily., Disp: 30 tablet, Rfl: 0   methylPREDNISolone  (MEDROL  DOSEPAK) 4 MG TBPK tablet, Take as directed (Patient not taking: Reported on 11/01/2023), Disp: 21 each, Rfl: 0  Social History   Tobacco Use  Smoking Status Never  Smokeless Tobacco Not on file    Allergies  Allergen Reactions   Diclofenac Itching   Objective:  There were no vitals filed for this visit. There is no height or weight on file to calculate BMI. Constitutional Well developed. Well nourished.  Vascular Dorsalis pedis pulses palpable bilaterally. Posterior tibial pulses palpable bilaterally. Capillary refill normal to all digits.  No cyanosis or clubbing noted. Pedal hair growth normal.  Neurologic Normal speech. Oriented to person, place, and time. Epicritic sensation to light touch grossly present bilaterally.  Dermatologic Nails well groomed and normal in appearance. No open wounds. No skin  lesions.  Orthopedic: Normal joint ROM without pain or crepitus bilaterally. No visible deformities. Tender to palpation at the calcaneal tuber left. No pain with calcaneal squeeze left. Ankle ROM diminished range of motion left. Silfverskiold Test: negative bilaterally.   Radiographs: None  Assessment:   1. Plantar fasciitis of left foot     Plan:  Patient was evaluated and treated and all questions answered.  Plantar Fasciitis, left - XR reviewed as above.  - Educated on icing and stretching. Instructions given.  - Patient has what injection would like to focus on oral medication and bracing - DME: Plantar fascial brace dispensed to support the medial longitudinal arch of the foot and offload pressure from the heel and prevent arch collapse during weightbearing - Pharmacologic management: Continue meloxicam  as needed - Physical therapy prescription was given for the patient she will start immediately  Pes planovalgus -I explained to patient the etiology of pes planovalgus and relationship with Planter fasciitis and various treatment options were discussed.  Given patient foot structure in the setting of Planter fasciitis I believe patient will benefit from custom-made orthotics to help control the hindfoot motion support the arch of the foot and take the stress away from plantar fascial.  Patient agrees with the plan like to proceed with orthotics - Orthotics were dispensed they are functioning well   No follow-ups on file.

## 2024-05-08 ENCOUNTER — Ambulatory Visit: Admitting: Dermatology
